# Patient Record
Sex: Male | Born: 1985 | Race: White | Hispanic: No | Marital: Married | Smoking: Never smoker
Health system: Southern US, Community
[De-identification: ages and names within clinical notes are randomized; demographics above are authoritative.]

## PROBLEM LIST (undated history)

## (undated) HISTORY — PX: FRACTURE SURGERY: SHX138

---

## 2015-01-08 ENCOUNTER — Emergency Department
Admission: EM | Admit: 2015-01-08 | Discharge: 2015-01-08 | Disposition: A | Discharge status: Home / Self Care | Payer: BC Managed Care – PPO | Attending: Emergency Medicine | Admitting: Emergency Medicine

## 2015-01-08 ENCOUNTER — Emergency Department: Payer: BC Managed Care – PPO

## 2015-01-08 DIAGNOSIS — S29011A Strain of muscle and tendon of front wall of thorax, initial encounter: Secondary | ICD-10-CM | POA: Diagnosis not present

## 2015-01-08 DIAGNOSIS — Z79899 Other long term (current) drug therapy: Secondary | ICD-10-CM | POA: Diagnosis not present

## 2015-01-08 DIAGNOSIS — S199XXA Unspecified injury of neck, initial encounter: Secondary | ICD-10-CM | POA: Diagnosis present

## 2015-01-08 DIAGNOSIS — Y9389 Activity, other specified: Secondary | ICD-10-CM | POA: Diagnosis not present

## 2015-01-08 DIAGNOSIS — Y92410 Unspecified street and highway as the place of occurrence of the external cause: Secondary | ICD-10-CM | POA: Diagnosis not present

## 2015-01-08 DIAGNOSIS — S161XXA Strain of muscle, fascia and tendon at neck level, initial encounter: Secondary | ICD-10-CM | POA: Diagnosis not present

## 2015-01-08 DIAGNOSIS — S29019A Strain of muscle and tendon of unspecified wall of thorax, initial encounter: Secondary | ICD-10-CM

## 2015-01-08 DIAGNOSIS — Y998 Other external cause status: Secondary | ICD-10-CM | POA: Insufficient documentation

## 2015-01-08 MED ORDER — IBUPROFEN 600 MG PO TABS
600.0000 mg | ORAL_TABLET | Freq: Once | ORAL | Status: AC
  Administered 2015-01-08: 600 mg via ORAL
  Filled 2015-01-08: qty 1

## 2015-01-08 MED ORDER — TRAMADOL HCL 50 MG PO TABS: 50.0000 mg | ORAL_TABLET | Freq: Once | ORAL | Status: DC

## 2015-01-08 MED ORDER — IBUPROFEN 800 MG PO TABS: 800.0000 mg | ORAL_TABLET | Freq: Three times a day (TID) | ORAL | Status: AC

## 2015-01-08 MED ORDER — DIAZEPAM 2 MG PO TABS: 2.0000 mg | ORAL_TABLET | Freq: Three times a day (TID) | ORAL | Status: DC | PRN

## 2015-01-08 NOTE — Discharge Instructions (Signed)
Cervical Sprain A cervical sprain is when the tissues (ligaments) that hold the neck bones in place stretch or tear. HOME CARE   Put ice on the injured area.  Put ice in a plastic bag.  Place a towel between your skin and the bag.  Leave the ice on for 15-20 minutes, 3-4 times a day.  You may have been given a collar to wear. This collar keeps your neck from moving while you heal.  Do not take the collar off unless told by your doctor.  If you have long hair, keep it outside of the collar.  Ask your doctor before changing the position of your collar. You may need to change its position over time to make it more comfortable.  If you are allowed to take off the collar for cleaning or bathing, follow your doctor's instructions on how to do it safely.  Keep your collar clean by wiping it with mild soap and water. Dry it completely. If the collar has removable pads, remove them every 1-2 days to hand wash them with soap and water. Allow them to air dry. They should be dry before you wear them in the collar.  Do not drive while wearing the collar.  Only take medicine as told by your doctor.  Keep all doctor visits as told.  Keep all physical therapy visits as told.  Adjust your work station so that you have good posture while you work.  Avoid positions and activities that make your problems worse.  Warm up and stretch before being active. GET HELP IF:  Your pain is not controlled with medicine.  You cannot take less pain medicine over time as planned.  Your activity level does not improve as expected. GET HELP RIGHT AWAY IF:   You are bleeding.  Your stomach is upset.  You have an allergic reaction to your medicine.  You develop new problems that you cannot explain.  You lose feeling (become numb) or you cannot move any part of your body (paralysis).  You have tingling or weakness in any part of your body.  Your symptoms get worse. Symptoms include:  Pain,  soreness, stiffness, puffiness (swelling), or a burning feeling in your neck.  Pain when your neck is touched.  Shoulder or upper back pain.  Limited ability to move your neck.  Headache.  Dizziness.  Your hands or arms feel week, lose feeling, or tingle.  Muscle spasms.  Difficulty swallowing or chewing. MAKE SURE YOU:   Understand these instructions.  Will watch your condition.  Will get help right away if you are not doing well or get worse.   This information is not intended to replace advice given to you by your health care provider. Make sure you discuss any questions you have with your health care provider.   Document Released: 08/30/2007 Document Revised: 11/13/2012 Document Reviewed: 09/18/2012 Elsevier Interactive Patient Education 2016 [employer]   Moist heat or ice to his neck muscles as needed. Take muscle relaxants only when not driving. Ibuprofen 3 times a day with food. [facility] your doctor or [facility] if any continued problems.
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
## 2016-08-13 ENCOUNTER — Encounter: Payer: Self-pay | Admitting: Emergency Medicine

## 2016-08-13 ENCOUNTER — Ambulatory Visit
Admission: EM | Admit: 2016-08-13 | Discharge: 2016-08-13 | Disposition: A | Discharge status: Home / Self Care | Payer: BC Managed Care – PPO | Attending: Family Medicine | Admitting: Family Medicine

## 2016-08-13 ENCOUNTER — Ambulatory Visit (INDEPENDENT_AMBULATORY_CARE_PROVIDER_SITE_OTHER): Payer: BC Managed Care – PPO

## 2016-08-13 DIAGNOSIS — S39012A Strain of muscle, fascia and tendon of lower back, initial encounter: Secondary | ICD-10-CM | POA: Diagnosis not present

## 2016-08-13 MED ORDER — NAPROXEN 500 MG PO TABS: 500.0000 mg | ORAL_TABLET | Freq: Two times a day (BID) | ORAL | 0 refills | Status: AC

## 2016-08-13 MED ORDER — METAXALONE 800 MG PO TABS: 800.0000 mg | ORAL_TABLET | Freq: Three times a day (TID) | ORAL | 0 refills | Status: AC

## 2016-08-13 MED ORDER — DIAZEPAM 2 MG PO TABS: ORAL_TABLET | ORAL | 0 refills | Status: AC

## 2016-08-13 MED ORDER — KETOROLAC TROMETHAMINE 60 MG/2ML IM SOLN
60.0000 mg | Freq: Once | INTRAMUSCULAR | Status: AC
  Administered 2016-08-13: 60 mg via INTRAMUSCULAR
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
## 2016-08-13 NOTE — ED Provider Notes (Signed)
CSN: [id number]     Arrival date & time 08/13/16  0944 History   None    Chief Complaint  Patient presents with  . Back Pain   (Consider location/radiation/quality/duration/timing/severity/associated sxs/prior Treatment) HPI  This a 31 year old male who presents with feeding lower back pain that started approximately 8 days ago. He states he worked out in [employer] prior to the onset of the pain does not remember increasing the intensity or the feet of his workouts. He states that he had a 4 hour car trip shortly afterwards and then the pain began. Said no incontinence. He does relate that several back injuries in the past but nothing is been as painful or lasted as long. His pain is confined in the lower lumbar segments. He states that the only comfortable positions or standing or lying recumbent. He has had  increase of pain with any coughing sneezing.       History reviewed. No pertinent past medical history. Past Surgical History:  Procedure Laterality Date  . FRACTURE SURGERY     Family History  Problem Relation Age of Onset  . Thyroid disease Mother   . Thyroid disease Father    Social History  Substance Use Topics  . Smoking status: Never Smoker  . Smokeless tobacco: Never Used  . Alcohol use No    Review of Systems  Constitutional: Positive for activity change. Negative for chills, fatigue and fever.  Musculoskeletal: Positive for back pain.  All other systems reviewed and are negative.   Allergies  Patient has no known allergies.  Home Medications   Prior to Admission medications   Medication Sig Start Date End Date Taking? Authorizing Provider  diazepam (VALIUM) 2 MG tablet Take 1 tablet 3 times daily for muscle spasm. After completing Valium prescription switch to Skelaxin 08/13/16   Lutricia Feiloemer, Jaylen Claude P, PA-C  ibuprofen (ADVIL,MOTRIN) 800 MG tablet Take 1 tablet (800 mg total) by mouth 3 (three) times daily. 01/08/15   Tommi RumpsSummers, Rhonda L, PA-C  metaxalone  (SKELAXIN) 800 MG tablet Take 1 tablet (800 mg total) by mouth 3 (three) times daily. 08/13/16   Lutricia Feiloemer, Mauri Temkin P, PA-C  methylphenidate 36 MG PO CR tablet Take 36 mg by mouth daily.    [provider]  naproxen (NAPROSYN) 500 MG tablet Take 1 tablet (500 mg total) by mouth 2 (two) times daily with a meal. 08/13/16   Lutricia Feiloemer, Farrell Broerman P, PA-C   Meds Ordered and Administered this Visit   Medications  ketorolac (TORADOL) injection 60 mg (60 mg Intramuscular Given 08/13/16 1027)    BP (!) 140/96 (BP Location: Left Arm)   Pulse 70   Temp 98 F (36.7 C) (Oral)   Resp 16   Ht 5\' 9"  (1.753 m)   Wt 220 lb (99.8 kg)   SpO2 96%   BMI 32.49 kg/m  No data found.   Physical Exam  Constitutional: He is oriented to person, place, and time. He appears well-developed and well-nourished. No distress.  HENT:  Head: Normocephalic.  Eyes: Pupils are equal, round, and reactive to light.  Neck: Normal range of motion.  Musculoskeletal: He exhibits tenderness.  Examination of the lumbar spine shows obvious scoliosis present. He has visible visual and palpable muscle spasm from approximately L3-L5 more on the right. He is able to forward flex with his hands to level of his knees. Lateral flexion is smooth but with complaints of discomfort on the left. Thoracic rotation causes mild pain. Patient is able to toe walk  and heel walk adequately. EHL peroneal and anterior tibialis are strong to clinical testing. Sensation is intact to light touch throughout. DTRs are 2+ over 4 at the knee and ankles. There is no clonus present. Straight leg raise testing is positive on the right at approximately 45 with low back pain ;is negative on the left.  Neurological: He is alert and oriented to person, place, and time.  Skin: Skin is warm and dry. He is not diaphoretic.  Psychiatric: He has a normal mood and affect. His behavior is normal. Judgment and thought content normal.  Nursing note and vitals  reviewed.   Urgent Care Course     Procedures (including critical care time)  Labs Review Labs Reviewed - No data to display  Imaging Review Dg Lumbar Spine Complete  Result Date: 08/13/2016 CLINICAL DATA:  Pt with mid lower back pain x one week. Pt is not aware of any trauma or injury to cause pain. No hx of previous injury or trauma. EXAM: LUMBAR SPINE - COMPLETE 4+ VIEW COMPARISON:  None. FINDINGS: No fracture.  No spondylolisthesis.  No bone lesion. Mild loss disc height at L4-L5. Remaining lumbar disc spaces are well preserved. Facet joints are well preserved. Soft tissues are unremarkable. IMPRESSION: 1. Mild loss disc height at L4-L5.  No other abnormalities. Electronically Signed   By: Amie Portland M.D.   On: 08/13/2016 10:49     Visual Acuity Review  Right Eye Distance:   Left Eye Distance:   Bilateral Distance:    Right Eye Near:   Left Eye Near:    Bilateral Near:    Medications  ketorolac (TORADOL) injection 60 mg (60 mg Intramuscular Given 08/13/16 1027)       MDM   1. Strain of lumbar region, initial encounter    Discharge Medication List as of 08/13/2016 11:22 AM    START taking these medications   Details  diazepam (VALIUM) 2 MG tablet Take 1 tablet 3 times daily for muscle spasm. After completing Valium prescription switch to Skelaxin, Print    metaxalone (SKELAXIN) 800 MG tablet Take 1 tablet (800 mg total) by mouth 3 (three) times daily., Starting Sun 08/13/2016, Normal    naproxen (NAPROSYN) 500 MG tablet Take 1 tablet (500 mg total) by mouth 2 (two) times daily with a meal., Starting Sun 08/13/2016, Normal      Plan: 1. Test/x-ray results and diagnosis reviewed with patient 2. rx as per orders; risks, benefits, potential side effects reviewed with patient 3. Recommend supportive treatment with Rest and symptom avoidance. Avoid sitting lifting and bending. Use of Valium for 2 days then switch over to Skelaxin. Caution regarding activities requiring  concentration and judgment and not driving or taking either the Valium or Skelaxin. Patient should follow-up with his primary care physician if he continues to have problems. He may do well with a physical therapy program certainly with core strengthening exercises 4. F/u prn if symptoms worsen or don't improve     Lutricia Feil, PA-C 08/13/16 1129

## 2017-12-01 IMAGING — CR DG LUMBAR SPINE COMPLETE 4+V
5 series · 5 of 5 positions shown · non-contrast
Comparison: None.

CLINICAL DATA: Pt with mid lower back pain x one week. Pt is not
aware of any trauma or injury to cause pain. No hx of previous
injury or trauma.

EXAM:
LUMBAR SPINE - COMPLETE 4+ VIEW

[l-spine ap]
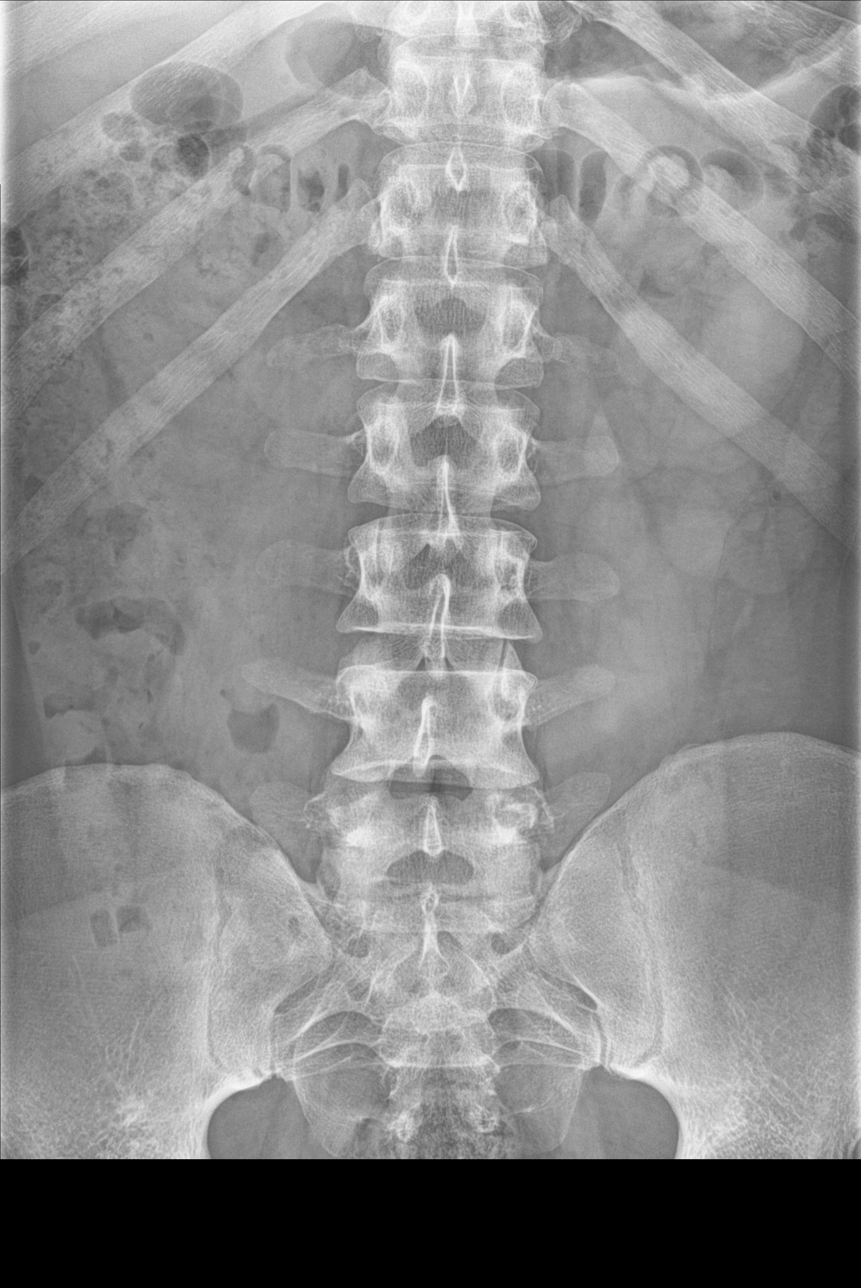

[l-spine obl (1 of 2)]
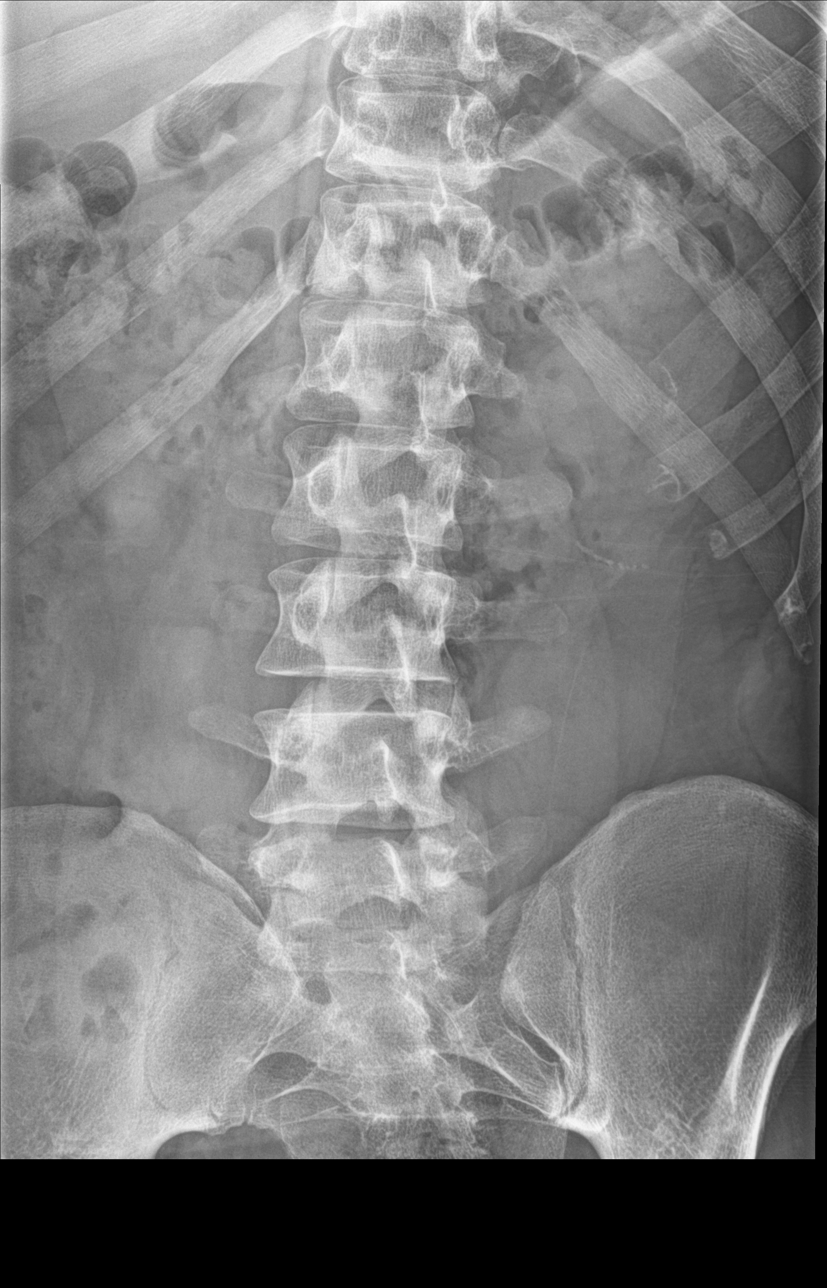

[l-spine obl (2 of 2)]
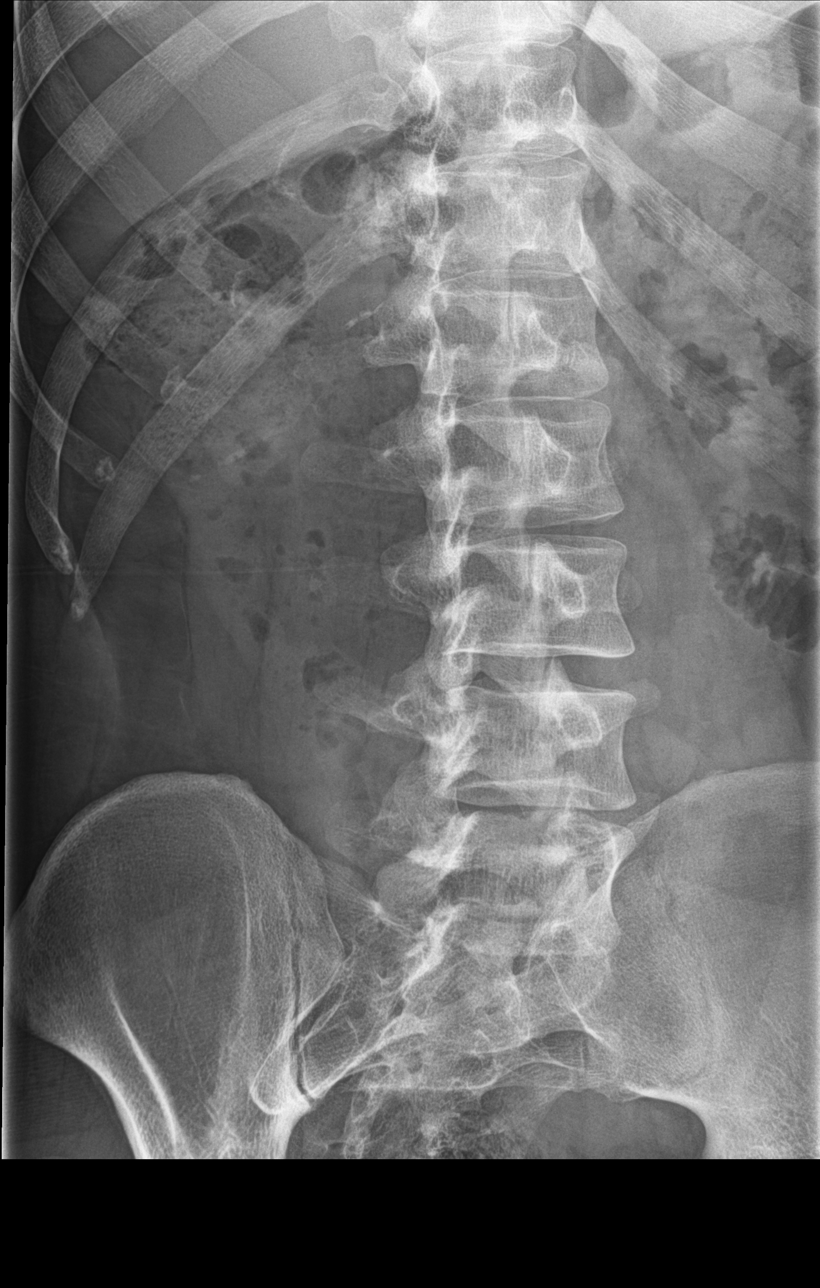

[l-spine lat]
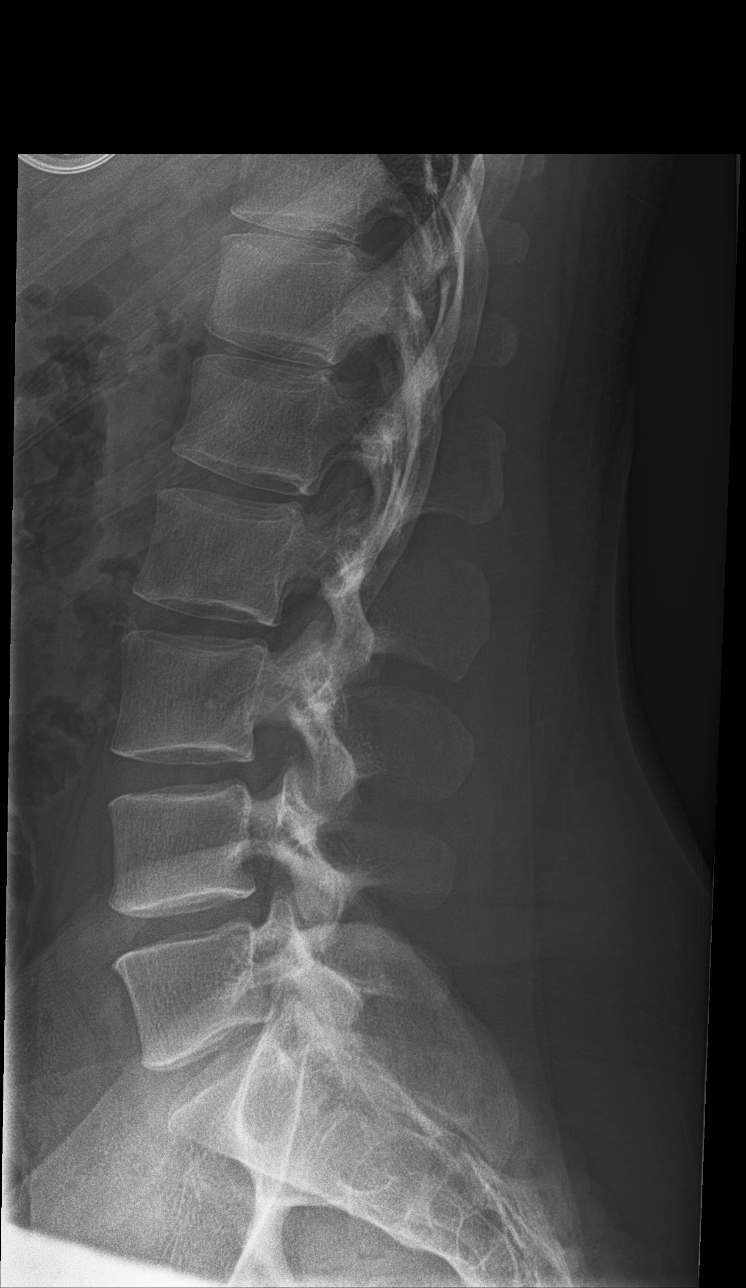

[l-spine spot]
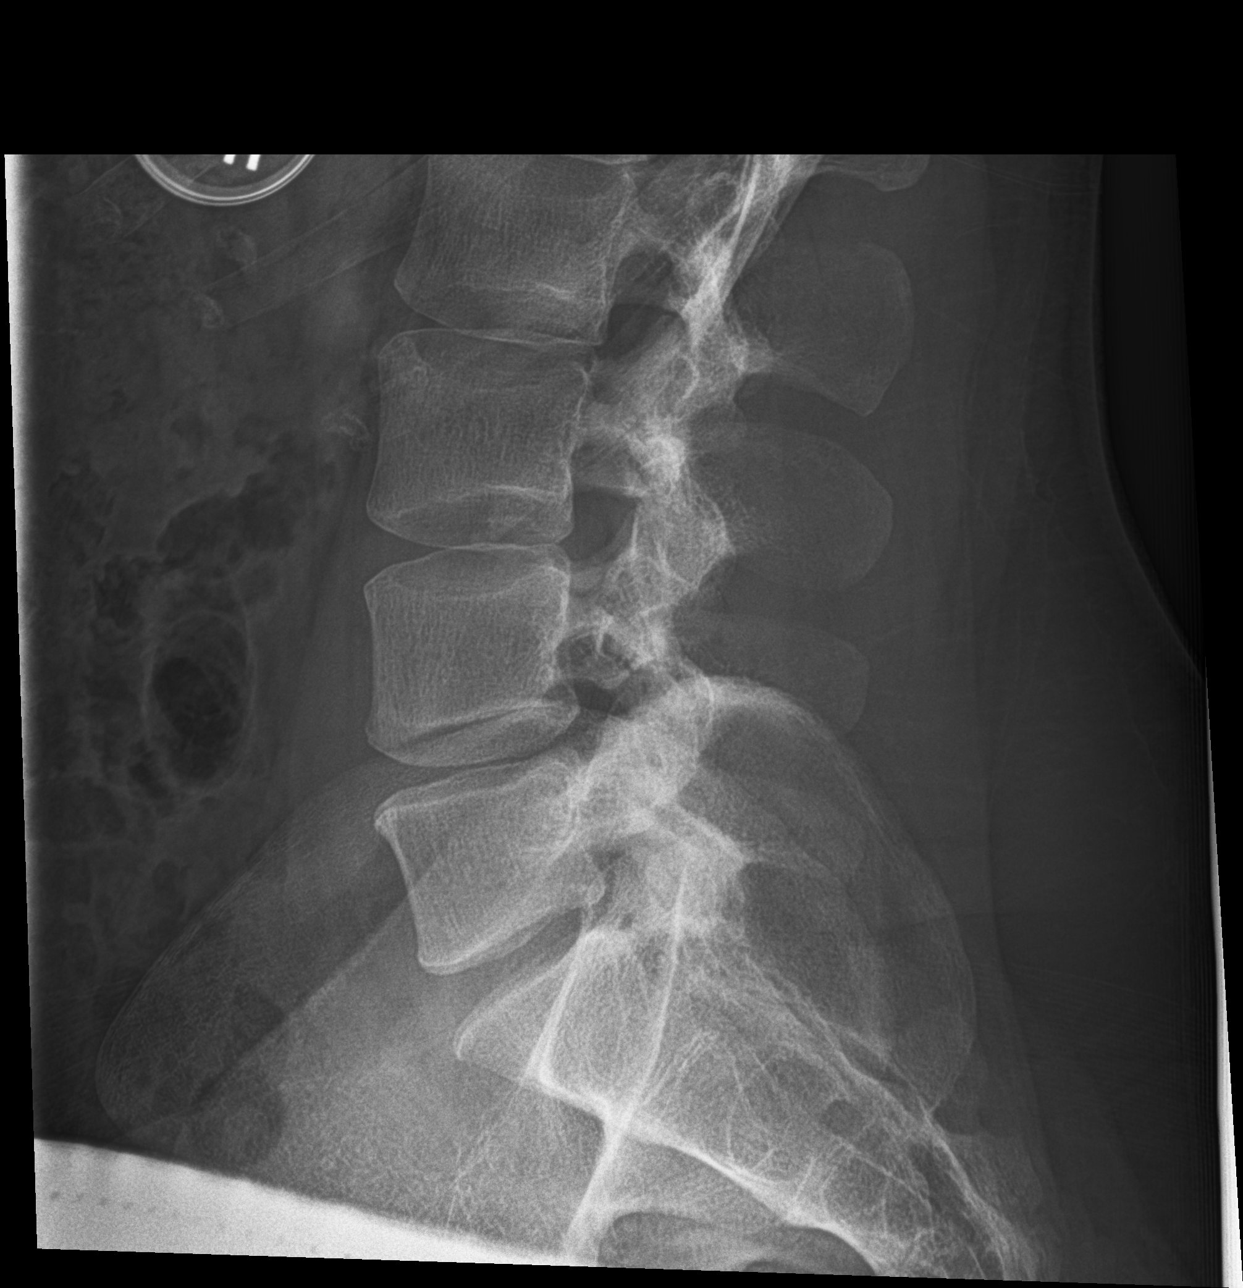

[5 of 5 positions shown; findings below may reference images not displayed]

FINDINGS: No fracture.  No spondylolisthesis.  No bone lesion.

Mild loss disc height at L4-L5. Remaining lumbar disc spaces are
well preserved. Facet joints are well preserved.

Soft tissues are unremarkable.
IMPRESSION: 1. Mild loss disc height at L4-L5.  No other abnormalities.
